# Patient Record
Sex: Male | Born: 1995 | Race: Black or African American | Hispanic: No | Marital: Single | Smoking: Current some day smoker
Health system: Southern US, Community
[De-identification: ages and names within clinical notes are randomized; demographics above are authoritative.]

---

## 2017-04-29 ENCOUNTER — Encounter (HOSPITAL_COMMUNITY): Payer: Self-pay | Admitting: Emergency Medicine

## 2017-04-29 DIAGNOSIS — K029 Dental caries, unspecified: Secondary | ICD-10-CM | POA: Insufficient documentation

## 2017-04-30 ENCOUNTER — Emergency Department (HOSPITAL_COMMUNITY)
Admission: EM | Admit: 2017-04-30 | Discharge: 2017-04-30 | Disposition: A | Discharge status: Home / Self Care | Payer: Self-pay | Attending: Emergency Medicine | Admitting: Emergency Medicine

## 2017-04-30 DIAGNOSIS — K029 Dental caries, unspecified: Secondary | ICD-10-CM

## 2017-04-30 MED ORDER — IBUPROFEN 600 MG PO TABS: 600.0000 mg | ORAL_TABLET | Freq: Four times a day (QID) | ORAL | 0 refills | Status: DC | PRN

## 2017-04-30 MED ORDER — PENICILLIN V POTASSIUM 500 MG PO TABS: 500.0000 mg | ORAL_TABLET | Freq: Three times a day (TID) | ORAL | 0 refills | Status: DC

## 2017-04-30 NOTE — ED Provider Notes (Signed)
[facility] EMERGENCY DEPARTMENT Provider Note   CSN: [id number] Arrival date & time: 04/29/17  2241     History   Chief Complaint Chief Complaint  Patient presents with  . Dental Pain    HPI Cameron Hamilton is a 21 y.o. male.  HPI   21 year old male presenting with c/o dental pain.  Patient report having recurrent left-sided dental pain ongoing for the past 2 months.  Pain is described as a aching throbbing sensation, with occasional numbness sensation radiates to the back of his neck and shoulder.  Pain usually improves with taking aspirin.  There is no associated fever, chills, ear pain, runny nose, sneezing or coughing or neck pain.  He was [place] seen [place] outside hospital [place] complaints and was prescribed antibiotic which he did not finish.  [place] and does not have a dentist.  Denies any increasing pain with temperature changes, denies any recent injury.  Patient report he used to have braces when he was a child but was unable to follow-up with a dentist to have it removed.  He removed most of the braces but still have some retained hardware still in place.     History reviewed. No pertinent past medical history.  There are no active problems to display for this patient.   History reviewed. No pertinent surgical history.     Home Medications    Prior to Admission medications   Not on File    Family History No family history on file.  Social History Social History   Tobacco Use  . Smoking status: Never Smoker  . Smokeless tobacco: Never Used  Substance Use Topics  . Alcohol use: Yes    Comment: occ  . Drug use: Yes    Types: Marijuana    Comment: occ     Allergies   Patient has no known allergies.   Review of Systems Review of Systems  Constitutional: Negative for fever.  HENT: Positive for dental problem.   Neurological: Negative for headaches.     Physical Exam Updated  Vital Signs BP 133/74 (BP Location: Right Arm)   Pulse (!) 124   Temp 98.1 F (36.7 C) (Oral)   Resp 12   Ht 5\' 8"  (1.727 m)   Wt 68 kg (150 lb)   SpO2 98%   BMI 22.81 kg/m   Physical Exam  Constitutional: He appears well-developed and well-nourished. No distress.  HENT:  Head: Atraumatic.  Mouth: mild tenderness to tooth #18, with braces hardware in place.  Small cavity noted.  No gingival erythema, no abscess, no facial involvement. No trismus.   Eyes: Conjunctivae are normal.  Neck: Neck supple.  Lymphadenopathy:    He has no cervical adenopathy.  Neurological: He is alert.  Skin: No rash noted.  Psychiatric: He has a normal mood and affect.  Nursing note and vitals reviewed.    ED Treatments / Results  Labs (all labs ordered are listed, but only abnormal results are displayed) Labs Reviewed - No data to display  EKG  EKG Interpretation None       Radiology No results found.  Procedures Procedures (including critical care time)  Medications Ordered in ED Medications - No data to display   Initial Impression / Assessment and Plan / ED Course  I have reviewed the triage vital signs and the nursing notes.  Pertinent labs & imaging results that were available during my care of  the patient were reviewed by me and considered in my medical decision making (see chart for details).     BP 133/74 (BP Location: Right Arm)   Pulse (!) 124   Temp 98.1 F (36.7 C) (Oral)   Resp 12   Ht 5\' 8"  (1.727 m)   Wt 68 kg (150 lb)   SpO2 98%   BMI 22.81 kg/m    Final Clinical Impressions(s) / ED Diagnoses   Final diagnoses:  Pain due to dental caries    ED Discharge Orders        Ordered    penicillin v potassium (VEETID) 500 MG tablet  3 times daily     04/30/17 0135    ibuprofen (ADVIL,MOTRIN) 600 MG tablet  Every 6 hours PRN     04/30/17 0135     Patient with dentalgia.  No abscess requiring immediate incision and drainage.  Exam not concerning for  Ludwig's angina or pharyngeal abscess.  Will treat with abx and nsaids. Pt instructed to follow-up with dentist.  Discussed return precautions. Pt safe for discharge.    Fayrene Helperran, Demichael Traum, PA-C 04/30/17 [id number]    Azalia Bilisampos, Kevin, MD 04/30/17 [phone]

## 2017-05-17 ENCOUNTER — Other Ambulatory Visit: Payer: Self-pay

## 2017-05-17 ENCOUNTER — Encounter: Payer: Self-pay | Admitting: Emergency Medicine

## 2017-05-17 ENCOUNTER — Emergency Department
Admission: EM | Admit: 2017-05-17 | Discharge: 2017-05-17 | Disposition: A | Discharge status: Home / Self Care | Payer: Self-pay | Attending: Emergency Medicine | Admitting: Emergency Medicine

## 2017-05-17 ENCOUNTER — Emergency Department: Payer: Self-pay

## 2017-05-17 DIAGNOSIS — R0789 Other chest pain: Secondary | ICD-10-CM | POA: Insufficient documentation

## 2017-05-17 DIAGNOSIS — K219 Gastro-esophageal reflux disease without esophagitis: Secondary | ICD-10-CM | POA: Insufficient documentation

## 2017-05-17 DIAGNOSIS — K5900 Constipation, unspecified: Secondary | ICD-10-CM | POA: Insufficient documentation

## 2017-05-17 LAB — BASIC METABOLIC PANEL
ANION GAP: 9 (ref 5–15)
BUN: 18 mg/dL (ref 6–20)
CO2: 27 mmol/L (ref 22–32)
Calcium: 9.4 mg/dL (ref 8.9–10.3)
Chloride: 103 mmol/L (ref 101–111)
Creatinine, Ser: 1.15 mg/dL (ref 0.61–1.24)
GFR calc Af Amer: >60 mL/min (ref >60)
GLUCOSE: 100 mg/dL — AB (ref 65–99)
POTASSIUM: 3.7 mmol/L (ref 3.5–5.1)
SODIUM: 139 mmol/L (ref 135–145)

## 2017-05-17 LAB — CBC
HEMATOCRIT: 46.8 % (ref 40.0–52.0)
HEMOGLOBIN: 15.9 g/dL (ref 13.0–18.0)
MCH: 29.5 pg (ref 26.0–34.0)
MCHC: 34 g/dL (ref 32.0–36.0)
MCV: 86.9 fL (ref 80.0–100.0)
Platelets: 257 10*3/uL (ref 150–440)
RBC: 5.39 MIL/uL (ref 4.40–5.90)
RDW: 13.4 % (ref 11.5–14.5)
WBC: 5.4 10*3/uL (ref 3.8–10.6)

## 2017-05-17 LAB — TROPONIN I: Troponin I: <0.03 ng/mL (ref <0.03)

## 2017-05-17 LAB — LIPASE, BLOOD: LIPASE: 20 U/L (ref 11–51)

## 2017-05-17 MED ORDER — GI COCKTAIL ~~LOC~~
30.0000 mL | Freq: Once | ORAL | Status: AC
  Administered 2017-05-17: 30 mL via ORAL
  Filled 2017-05-17: qty 30

## 2017-05-17 MED ORDER — RANITIDINE HCL 300 MG PO TABS: 300.0000 mg | ORAL_TABLET | Freq: Every day | ORAL | 1 refills | Status: DC

## 2017-05-17 NOTE — Discharge Instructions (Signed)
You may start Enid BaasMira Lax daily to help with constipation.   Take the Zantac daily either 30 minutes before breakfast or before bed.  Follow up with primary care when possible.  Return to the ER for symptoms of concern if unable to schedule an appointment.

## 2017-05-17 NOTE — ED Notes (Signed)
See triage note  Presents with generalized abd pain for the past 2 days  Describes pain as burning   Pain is from epigastric area into mid abd  Denies any n/v/  Fever or diarrhea

## 2017-05-17 NOTE — ED Provider Notes (Signed)
ED ECG REPORT I, Arelia Longestavid M Bailey Faiella, the attending physician, personally viewed and interpreted this ECG.   Date: 05/17/2017  EKG Time: 1049  Rate: 82  Rhythm: normal sinus rhythm  Axis: Normal  Intervals:none  ST&T Change: Concave ST elevations diffusely consistent with early repolarization.  No T wave inversions that are abnormal.    Myrna BlazerSchaevitz, Karely Hurtado Matthew, MD 05/17/17 [phone]

## 2017-05-17 NOTE — ED Provider Notes (Signed)
[facility] Emergency Department Provider Note  ___________________________________________   First MD Initiated Contact with Patient 05/17/17 1203     (approximate)  I have reviewed the triage vital signs and the nursing notes.   HISTORY  Chief Complaint Abdominal Pain and Chest Pain   HPI Cameron Hamilton is a 22 y.o. male who presents to the emergency department for evaluation and treatment of chest and abdomen "burning."  He denies any previous cardiac history.  He denies shortness of breath.  He denies illicit drug use.  Patient states that he has been drinking quite a bit of coffee recently, but other wise denies changes to his diet.  He has not taken any over-the-counter or other medications for this complaint.  History reviewed. No pertinent past medical history.  There are no active problems to display for this patient.   History reviewed. No pertinent surgical history.  Prior to Admission medications   Medication Sig Start Date End Date Taking? Authorizing Provider  ranitidine (ZANTAC) 300 MG tablet Take 1 tablet (300 mg total) by mouth at bedtime. 05/17/17 05/17/18  Chinita Pester, FNP    Allergies Patient has no known allergies.  History reviewed. No pertinent family history.  Social History Social History   Tobacco Use  . Smoking status: Never Smoker  . Smokeless tobacco: Never Used  Substance Use Topics  . Alcohol use: Yes    Comment: occ  . Drug use: Yes    Types: Marijuana    Comment: occ    Review of Systems  Constitutional: No fever/chills Eyes: No visual changes. ENT: No sore throat. Cardiovascular: Positive for burning sensation in his chest.   Respiratory: Denies shortness of breath. Gastrointestinal: Abdominal burning.  No nausea, no vomiting.  No diarrhea.  No constipation. Genitourinary: Negative for dysuria. Musculoskeletal: Negative for back pain. Skin: Negative for rash. Neurological: Negative for headaches,  focal weakness or numbness. ____________________________________________   PHYSICAL EXAM:  VITAL SIGNS: ED Triage Vitals  Enc Vitals Group     BP 05/17/17 1057 126/71     Pulse Rate 05/17/17 1057 88     Resp 05/17/17 1057 16     Temp 05/17/17 1057 98.2 F (36.8 C)     Temp Source 05/17/17 1057 Oral     SpO2 05/17/17 1057 100 %     Weight 05/17/17 1046 150 lb (68 kg)     Height 05/17/17 1046 5\' 8"  (1.727 m)     Head Circumference --      Peak Flow --      Pain Score 05/17/17 1045 10     Pain Loc --      Pain Edu? --      Excl. in [place]? --     Constitutional: Alert and oriented. Well appearing and in no acute distress. Eyes: Conjunctivae are normal. PERRL. Head: Atraumatic. Nose: No congestion/rhinnorhea. Mouth/Throat: Mucous membranes are moist.  Oropharynx non-erythematous. Neck: No stridor.   Cardiovascular: Normal rate, regular rhythm. Grossly normal heart sounds.  Good peripheral circulation. Respiratory: Normal respiratory effort.  No retractions. Lungs CTAB. Gastrointestinal: Soft and nontender. No distention. No abdominal bruits. No CVA tenderness. Musculoskeletal: No lower extremity tenderness nor edema.  No joint effusions. Neurologic:  Normal speech and language. No gross focal neurologic deficits are appreciated. No gait instability. Skin:  Skin is warm, dry and intact. No rash noted. Psychiatric: Mood and affect are normal. Speech and behavior are normal.  ____________________________________________   LABS (all labs ordered are listed,  but only abnormal results are displayed)  Labs Reviewed  BASIC METABOLIC PANEL - Abnormal; Notable for the following components:      Result Value   Glucose, Bld 100 (*)    All other components within normal limits  CBC  TROPONIN I  LIPASE, BLOOD   ____________________________________________  EKG  ED ECG REPORT I, Kem Boroughsari Graylee Arutyunyan, the FNP, personally viewed and interpreted this ECG.   Date: 05/17/2017  EKG Time:  10:49 AM  Rate: 82  Rhythm: Normal sinus rhythm with PR interval consistent with early repolarization.  Axis: No axis deviation  Intervals: No conduction defects.  ST&T Change: No ST depression or elevation  ____________________________________________  RADIOLOGY  Dg Chest 2 View  Result Date: 05/17/2017 CLINICAL DATA:  Central chest pain and burning. Abdominal discomfort. EXAM: CHEST  2 VIEW COMPARISON:  None. FINDINGS: Heart size is normal. Mediastinal shadows are normal. The lungs are clear. No bronchial thickening. No infiltrate, mass, effusion or collapse. Pulmonary vascularity is normal. No bony abnormality. IMPRESSION: Normal chest Electronically Signed   By: Paulina FusiMark  Shogry M.D.   On: 05/17/2017 11:17    ____________________________________________   PROCEDURES  Procedure(s) performed: None  Procedures  Critical Care performed: No  ____________________________________________   INITIAL IMPRESSION / ASSESSMENT AND PLAN / ED COURSE   22 year old male presenting to the emergency department for evaluation treatment of chest wall burning and abdominal burning which was relieved after GI cocktail.  Patient also states that he has issues with constipation intermittently.  He was given prescriptions for Zantac and advised to take MiraLAX daily.  He was also instructed to follow-up with primary care provider for symptoms that are not improving with these medications.  He was instructed to return to the emergency department for symptoms of change or worsen if he is unable to schedule appointment.      ____________________________________________   FINAL CLINICAL IMPRESSION(S) / ED DIAGNOSES  Final diagnoses:  Gastroesophageal reflux disease without esophagitis     ED Discharge Orders        Ordered    ranitidine (ZANTAC) 300 MG tablet  Daily at bedtime     05/17/17 1257       Note:  This document was prepared using Dragon voice recognition software and may include  unintentional dictation errors.    Chinita Pesterriplett, Rashell Shambaugh B, FNP 05/17/17 1315    Myrna BlazerSchaevitz, David Matthew, MD 05/17/17 [phone]

## 2017-05-17 NOTE — ED Triage Notes (Addendum)
C/o burning feeling to chest and abdomen. NAD. No other symptoms per pt.  Ambulatory. Unlabored.  Not worse or better with anything including food or no food.

## 2017-07-30 ENCOUNTER — Emergency Department (HOSPITAL_COMMUNITY)
Admission: EM | Admit: 2017-07-30 | Discharge: 2017-07-30 | Disposition: A | Discharge status: Home / Self Care | Payer: Self-pay | Attending: Emergency Medicine | Admitting: Emergency Medicine

## 2017-07-30 ENCOUNTER — Emergency Department (HOSPITAL_COMMUNITY): Payer: Self-pay

## 2017-07-30 ENCOUNTER — Encounter (HOSPITAL_COMMUNITY): Payer: Self-pay | Admitting: *Deleted

## 2017-07-30 ENCOUNTER — Other Ambulatory Visit: Payer: Self-pay

## 2017-07-30 DIAGNOSIS — M25552 Pain in left hip: Secondary | ICD-10-CM | POA: Insufficient documentation

## 2017-07-30 DIAGNOSIS — R1032 Left lower quadrant pain: Secondary | ICD-10-CM | POA: Insufficient documentation

## 2017-07-30 DIAGNOSIS — N50819 Testicular pain, unspecified: Secondary | ICD-10-CM

## 2017-07-30 DIAGNOSIS — N50812 Left testicular pain: Secondary | ICD-10-CM | POA: Insufficient documentation

## 2017-07-30 DIAGNOSIS — Z79899 Other long term (current) drug therapy: Secondary | ICD-10-CM | POA: Insufficient documentation

## 2017-07-30 LAB — URINALYSIS, ROUTINE W REFLEX MICROSCOPIC
BILIRUBIN URINE: NEGATIVE
Glucose, UA: NEGATIVE mg/dL
HGB URINE DIPSTICK: NEGATIVE
Ketones, ur: NEGATIVE mg/dL
Leukocytes, UA: NEGATIVE
Nitrite: NEGATIVE
PH: 7 (ref 5.0–8.0)
Protein, ur: NEGATIVE mg/dL
SPECIFIC GRAVITY, URINE: 1.017 (ref 1.005–1.030)

## 2017-07-30 MED ORDER — NAPROXEN 375 MG PO TABS: 375.0000 mg | ORAL_TABLET | Freq: Two times a day (BID) | ORAL | 0 refills | Status: AC

## 2017-07-30 NOTE — Discharge Instructions (Signed)
You will be contacted if your labs indicate additional treatment is needed. There is no evidence of epididymitis, testicular torsion, hernia, or other scrotal abnormalities on today's exam.

## 2017-07-30 NOTE — ED Triage Notes (Signed)
Pt states that he possibly has a hernia. Pain is in left hip and radiates down his thigh and into his left testicle. No acute distress is noted at triage. Denies urinary symptoms.

## 2017-07-30 NOTE — ED Provider Notes (Signed)
[facility] EMERGENCY DEPARTMENT Provider Note   CSN: [id number] Arrival date & time: 07/30/17  1302     History   Chief Complaint Chief Complaint  Patient presents with  . Hip Pain  . Groin Pain    HPI Cameron Hamilton is a 22 y.o. male.  Patient with left hip and left groin pain. Groin pain radiates into left testicle. Sexually active. Denies dysuria, discharge.   The history is provided by the patient. No language interpreter was used.  Hip Pain  This is a new problem. The current episode started more than 2 days ago. The problem has been gradually worsening.  Groin Pain  This is a new problem. The current episode started more than 2 days ago. The problem occurs every several days. The problem has not changed since onset.   History reviewed. No pertinent past medical history.  There are no active problems to display for this patient.   History reviewed. No pertinent surgical history.     Home Medications    Prior to Admission medications   Medication Sig Start Date End Date Taking? Authorizing Provider  ranitidine (ZANTAC) 300 MG tablet Take 1 tablet (300 mg total) by mouth at bedtime. 05/17/17 05/17/18  Chinita Pester, FNP    Family History History reviewed. No pertinent family history.  Social History Social History   Tobacco Use  . Smoking status: Never Smoker  . Smokeless tobacco: Never Used  Substance Use Topics  . Alcohol use: Yes    Comment: occ  . Drug use: Yes    Types: Marijuana    Comment: occ     Allergies   Patient has no known allergies.   Review of Systems Review of Systems  All other systems reviewed and are negative.    Physical Exam Updated Vital Signs BP 131/82 (BP Location: Right Arm)   Pulse 72   Temp 98.1 F (36.7 C) (Oral)   Resp 18   SpO2 100%   Physical Exam  Constitutional: He is oriented to person, place, and time. He appears well-developed and well-nourished.  HENT:  Head: Atraumatic.    Eyes: Conjunctivae are normal.  Neck: Neck supple.  Cardiovascular: Normal rate and regular rhythm.  Pulmonary/Chest: Effort normal and breath sounds normal.  Abdominal: Soft. Bowel sounds are normal. Hernia confirmed negative in the right inguinal area and confirmed negative in the left inguinal area.  Genitourinary: Penis normal. Cremasteric reflex is present. Left testis shows tenderness. Left testis shows no mass and no swelling. No penile tenderness.  Genitourinary Comments: Epididymal tenderness.   Musculoskeletal: Normal range of motion. He exhibits no tenderness or deformity.  Neurological: He is alert and oriented to person, place, and time.  Skin: Skin is warm and dry.  Psychiatric: He has a normal mood and affect.  Nursing note and vitals reviewed.    ED Treatments / Results  Labs (all labs ordered are listed, but only abnormal results are displayed) Labs Reviewed  RPR  HIV ANTIBODY (ROUTINE TESTING)  URINALYSIS, ROUTINE W REFLEX MICROSCOPIC  GC/CHLAMYDIA PROBE AMP ([place]) NOT AT [facility]    EKG  EKG Interpretation None       Radiology No results found.  Procedures Procedures (including critical care time)  Medications Ordered in ED Medications - No data to display   Initial Impression / Assessment and Plan / ED Course  I have reviewed the triage vital signs and the nursing notes.  Pertinent labs & imaging results that were available  during my care of the patient were reviewed by me and considered in my medical decision making (see chart for details).     Patient with sciatica like presentation of pain in left leg. No neurological deficits and normal neuro exam.  Patient is ambulatory.  No loss of bowel or bladder control.  No concern for cauda equina.  No fever, night sweats, weight loss, h/o cancer, IVDA, no recent procedure to back. No urinary symptoms suggestive of UTI.  Supportive care and return precaution discussed. Appears safe for discharge at  this time. Follow up as indicated in discharge paperwork.   Patient also reporting left sided scrotal/testicle pain. No indication of torsion, epididymitis, hydrocele, or varicocele. No hernia on exam. Patient is sexually active, STD screening performed. Discussed safe sexual practices. Pt appears safe for discharge.   Final Clinical Impressions(s) / ED Diagnoses   Final diagnoses:  Left hip pain  Left inguinal pain    ED Discharge Orders        Ordered    naproxen (NAPROSYN) 375 MG tablet  2 times daily     07/30/17 2329       Felicie MornSmith, Kyle Stansell, NP 07/31/17 0133    Maia PlanLong, Joshua G, MD 07/31/17 [phone]

## 2017-07-30 NOTE — ED Notes (Signed)
ED Provider at bedside. 

## 2017-07-30 NOTE — ED Notes (Signed)
Patient transported to Ultrasound 

## 2017-07-31 LAB — HIV ANTIBODY (ROUTINE TESTING W REFLEX): HIV Screen 4th Generation wRfx: NONREACTIVE

## 2017-07-31 LAB — RPR: RPR Ser Ql: NONREACTIVE

## 2017-07-31 LAB — GC/CHLAMYDIA PROBE AMP (~~LOC~~) NOT AT ARMC
Chlamydia: NEGATIVE
NEISSERIA GONORRHEA: NEGATIVE

## 2018-04-14 IMAGING — CR DG CHEST 2V
1 series · 2 of 2 positions shown · non-contrast
Comparison: None.

CLINICAL DATA: Central chest pain and burning. Abdominal
discomfort.

EXAM:
CHEST  2 VIEW

[Series 1: w chest pa · 0.14mm/px · 2 of 2 slices shown]
[im 1/2]
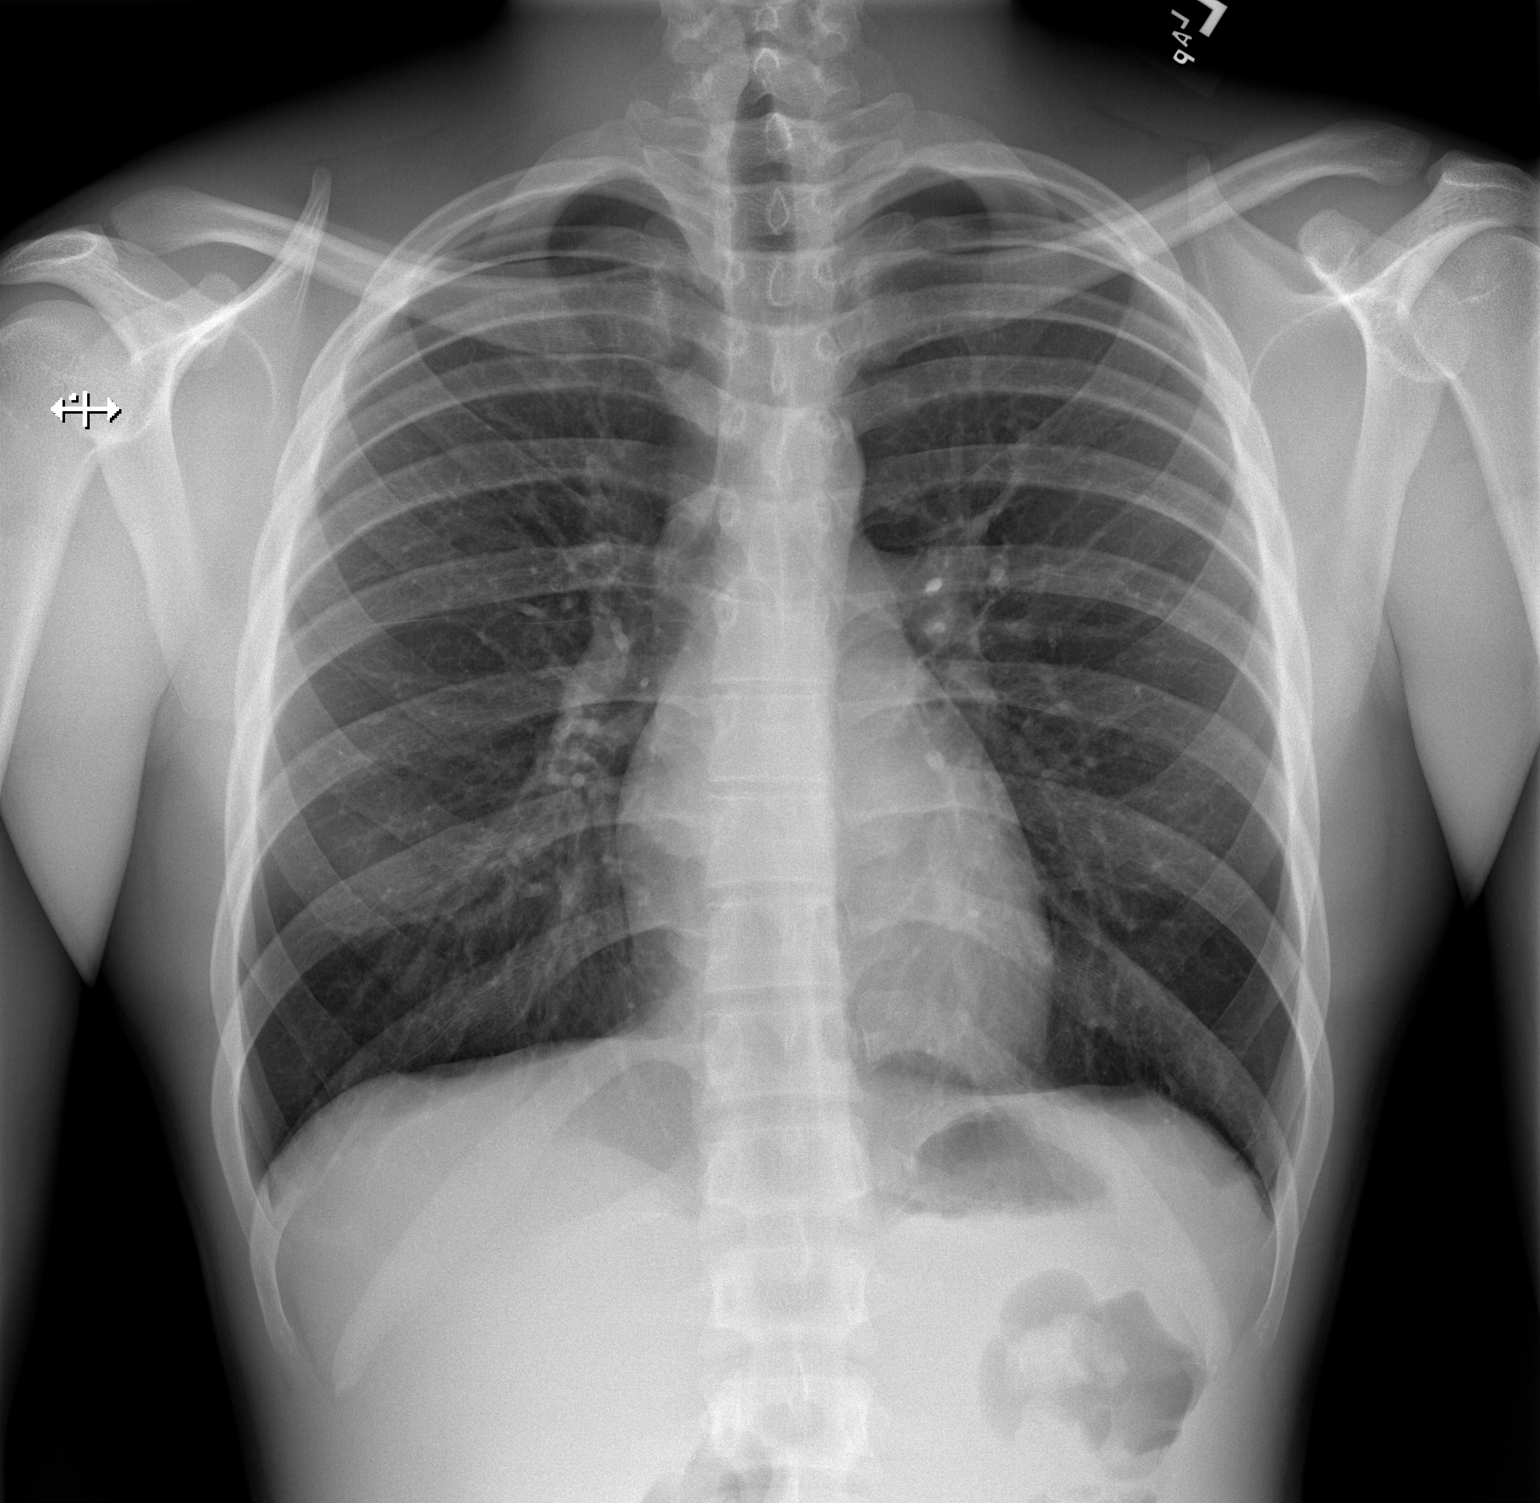
[im 2/2]
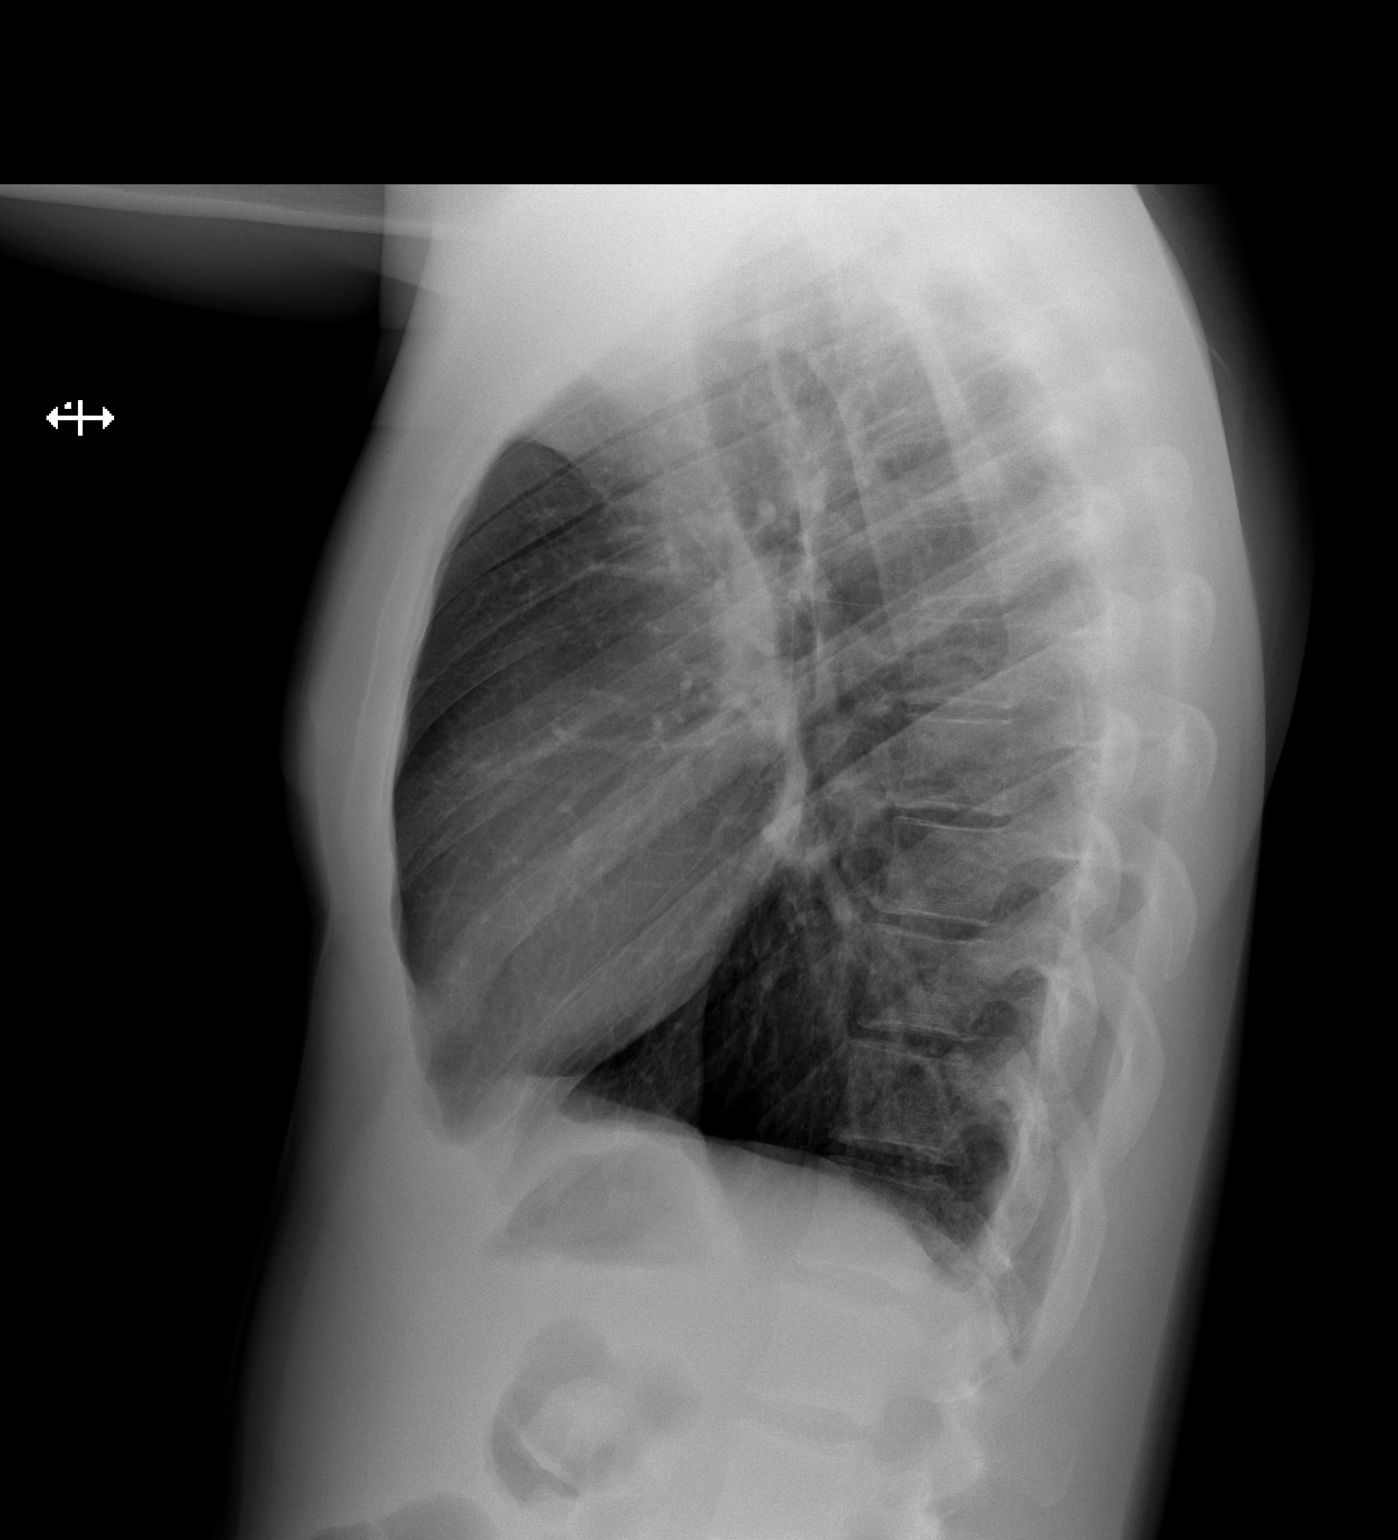

[2 of 2 positions shown; findings below may reference images not displayed]

FINDINGS: Heart size is normal. Mediastinal shadows are normal. The lungs are
clear. No bronchial thickening. No infiltrate, mass, effusion or
collapse. Pulmonary vascularity is normal. No bony abnormality.
IMPRESSION: Normal chest

## 2018-08-22 IMAGING — US US SCROTUM W/ DOPPLER COMPLETE
1 series · 14 of 25 positions shown · non-contrast
Comparison: None.

CLINICAL DATA: Testicle pain for 1 month

EXAM:
SCROTAL ULTRASOUND
DOPPLER ULTRASOUND OF THE TESTICLES
TECHNIQUE: Complete ultrasound examination of the testicles, epididymis, and
other scrotal structures was performed. Color and spectral Doppler
ultrasound were also utilized to evaluate blood flow to the
testicles.

[Series 1: us scrotum w/ doppler complete · 0.06mm/px · 14 of 45 slices shown]
[im 1/45]
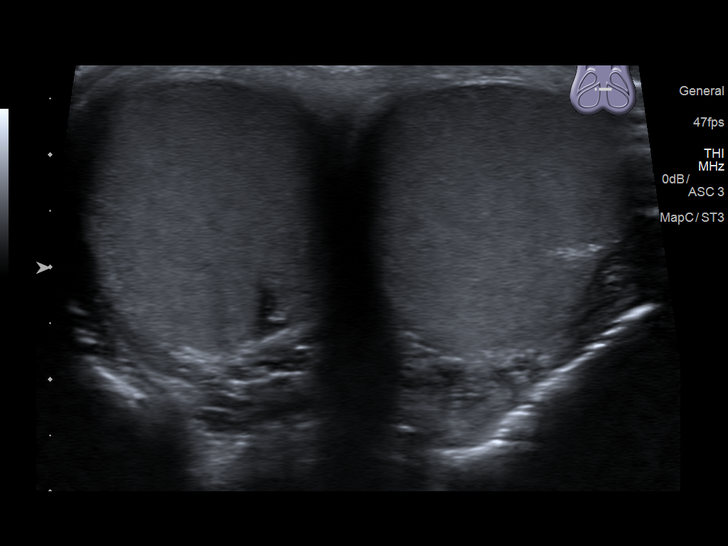
[im 4/45]
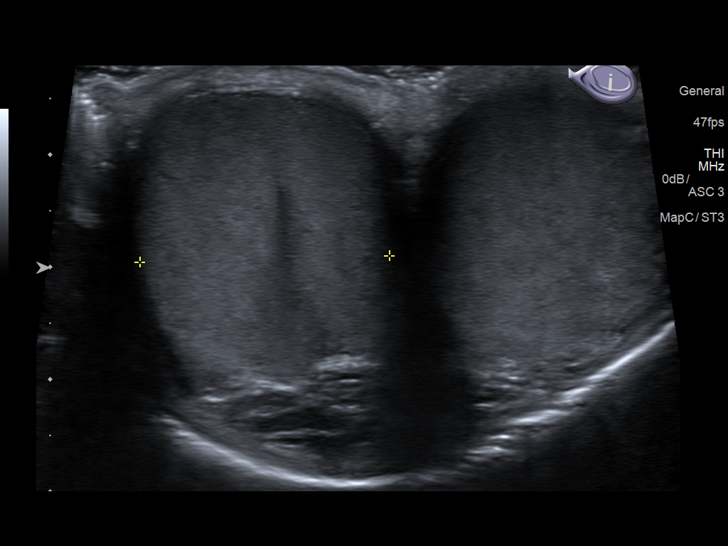
[im 8/45]
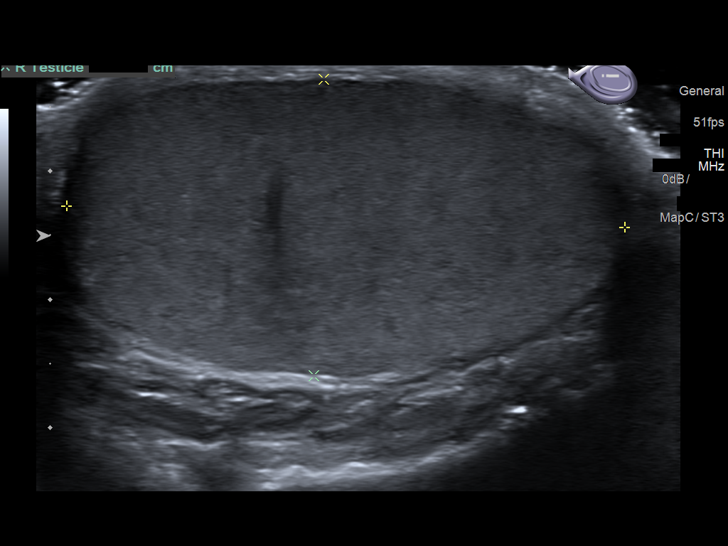
[im 12/45]
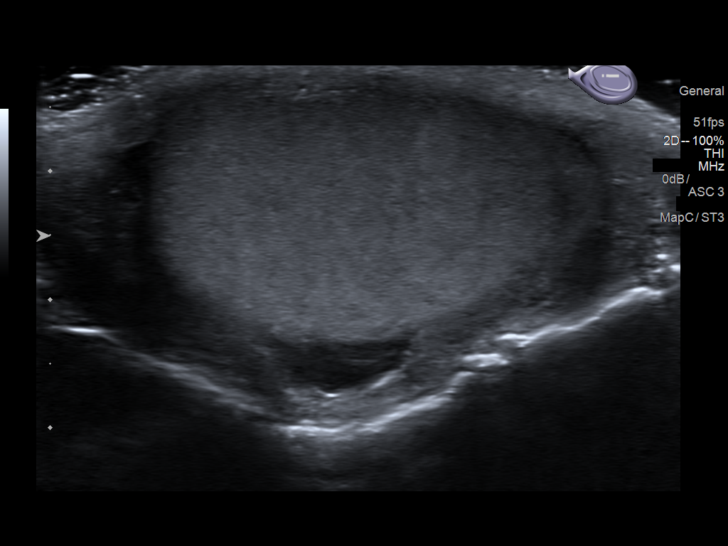
[im 15/45]
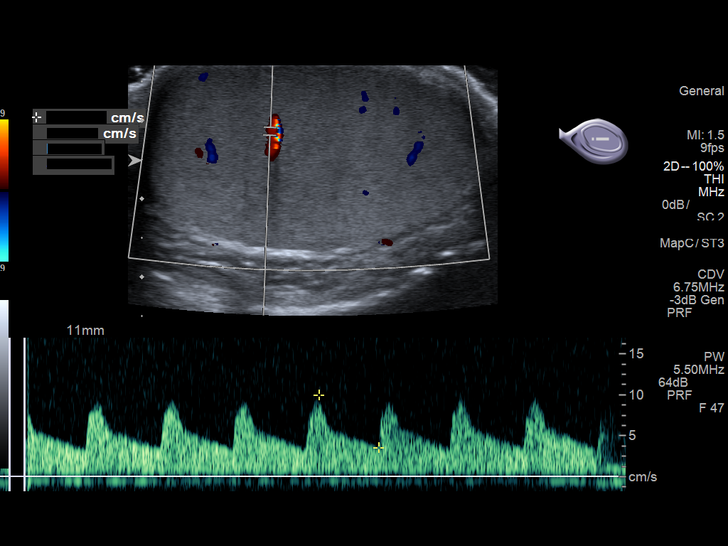
[im 17/45]
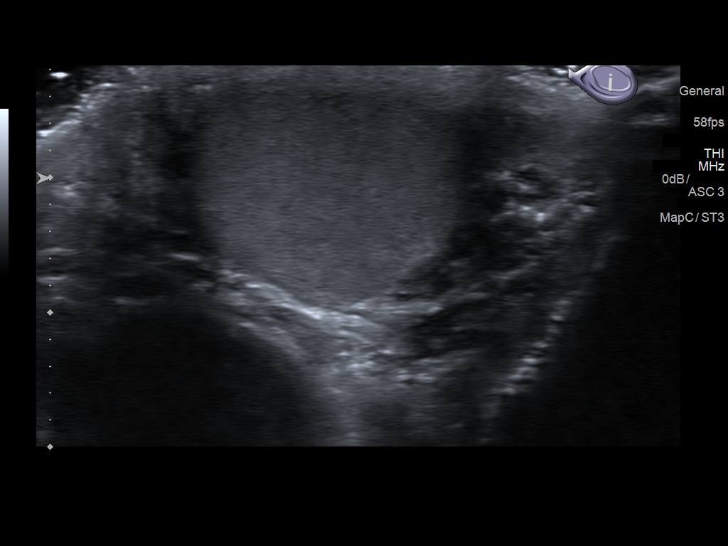
[im 21/45]
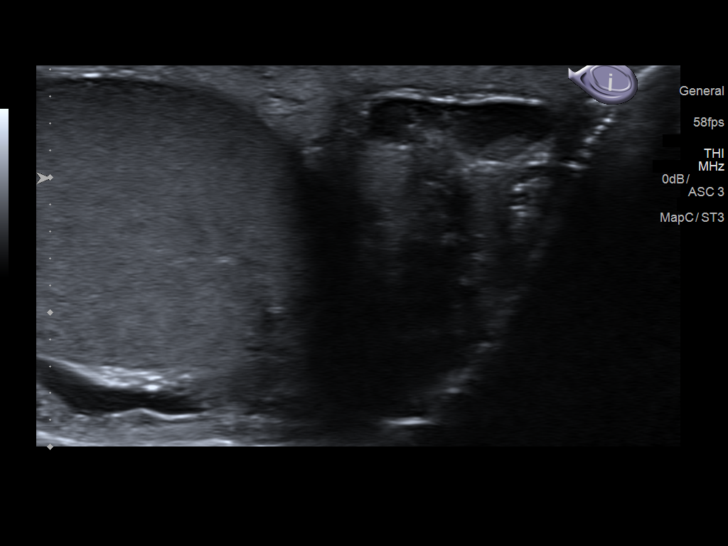
[im 24/45]
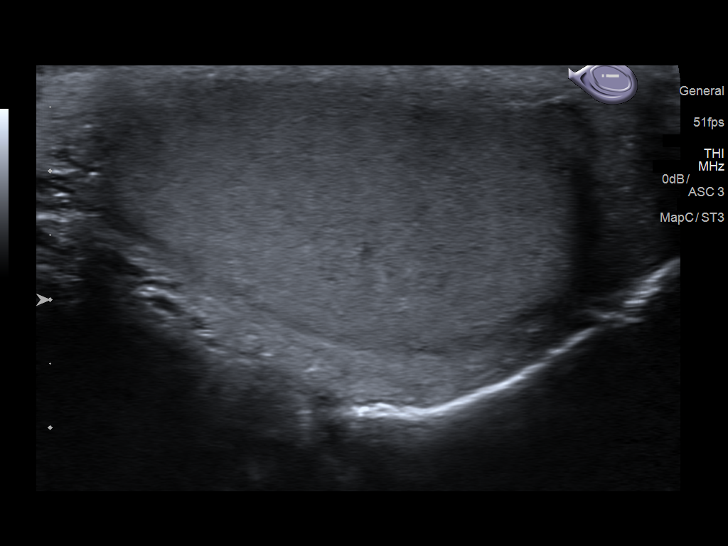
[im 28/45]
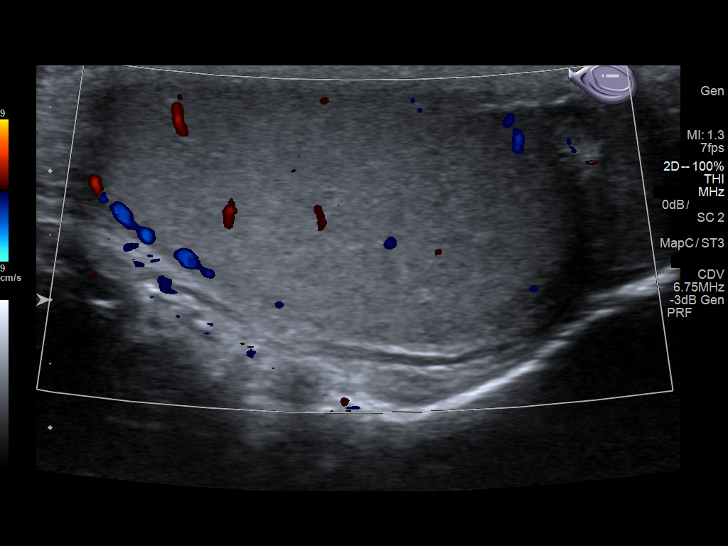
[im 30/45]
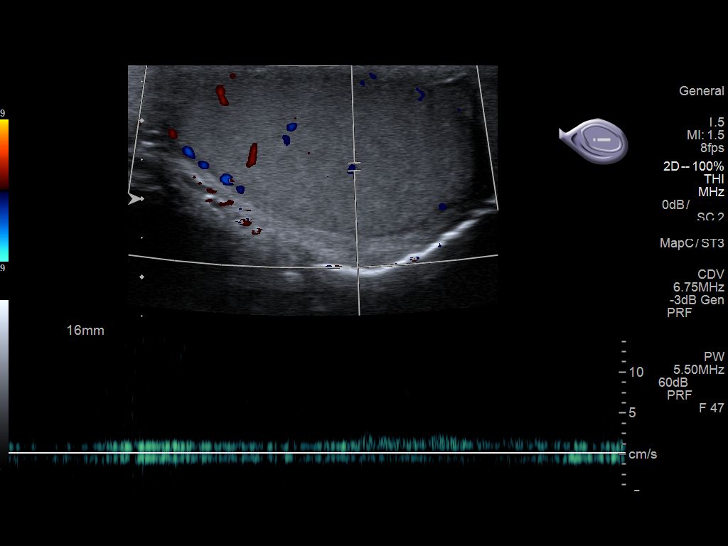
[im 34/45]
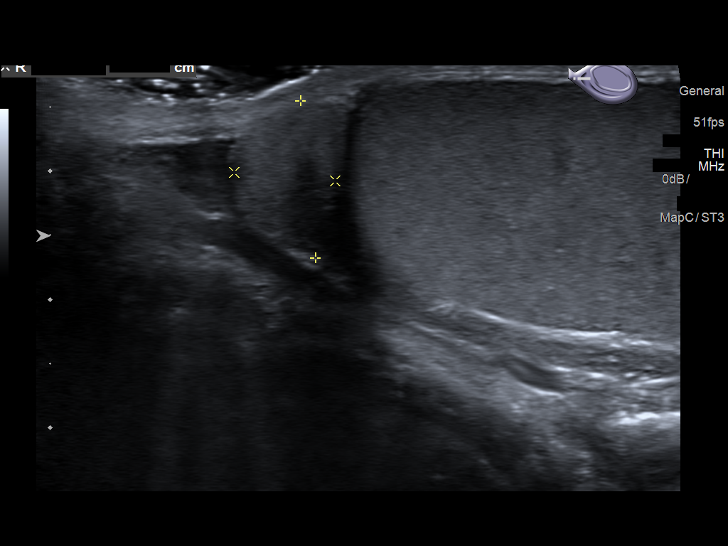
[im 37/45]
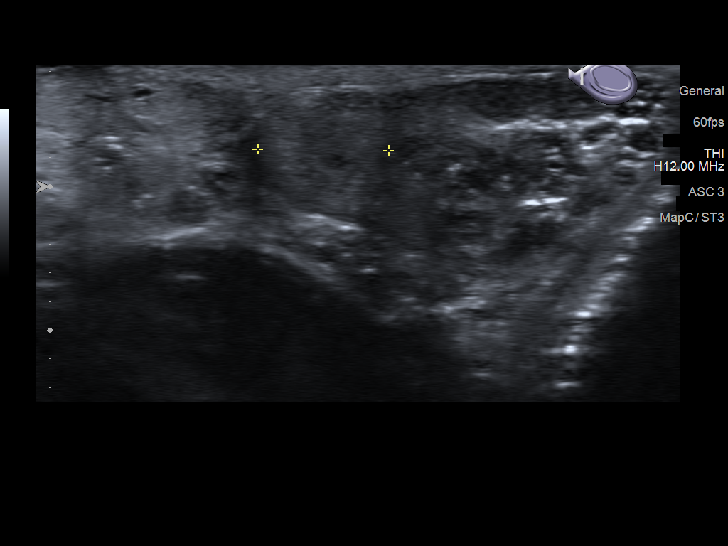
[im 41/45]
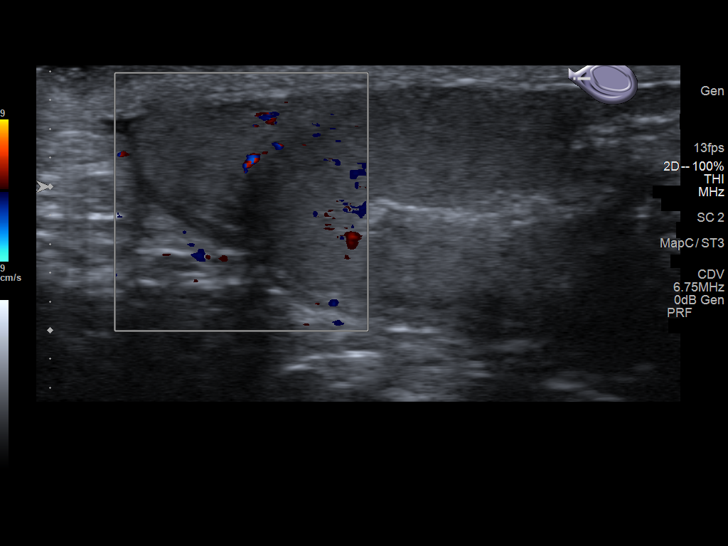
[im 45/45]
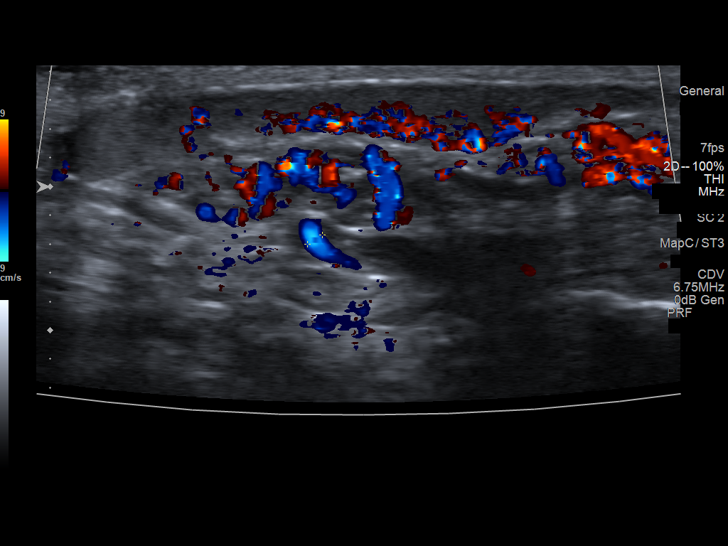

[14 of 25 positions shown; findings below may reference images not displayed]

FINDINGS: Right testicle

Measurements: 4.4 x 2.3 x 2.2 cm. No mass or microlithiasis
visualized.

Left testicle

Measurements: 4.5 x 2.7 x 2.7 cm. No mass or microlithiasis
visualized.

Right epididymis:  Normal in size and appearance.

Left epididymis:  Normal in size and appearance.

Hydrocele:  None visualized.

Varicocele:  None visualized.

Pulsed Doppler interrogation of both testes demonstrates normal low
resistance arterial and venous waveforms bilaterally.
IMPRESSION: Negative scrotal ultrasound

## 2022-06-17 ENCOUNTER — Other Ambulatory Visit: Payer: Self-pay

## 2022-06-17 ENCOUNTER — Emergency Department
Admission: EM | Admit: 2022-06-17 | Discharge: 2022-06-17 | Disposition: A | Discharge status: Home / Self Care | Payer: Self-pay | Attending: Emergency Medicine | Admitting: Emergency Medicine

## 2022-06-17 DIAGNOSIS — G4700 Insomnia, unspecified: Secondary | ICD-10-CM | POA: Insufficient documentation

## 2022-06-17 MED ORDER — TRAZODONE HCL 50 MG PO TABS: 50.0000 mg | ORAL_TABLET | Freq: Every day | ORAL | Status: DC

## 2022-06-17 MED ORDER — TRAZODONE HCL 50 MG PO TABS
50.0000 mg | ORAL_TABLET | ORAL | Status: AC
  Administered 2022-06-17: 50 mg via ORAL
  Filled 2022-06-17: qty 1

## 2022-06-17 MED ORDER — TRAZODONE HCL 50 MG PO TABS: 50.0000 mg | ORAL_TABLET | Freq: Every day | ORAL | 0 refills | Status: AC

## 2022-06-17 NOTE — ED Notes (Signed)
Pts family member, who dropped him off would like to be called re: pts sx.  He could not stay due to young children in the car.  Lorenzo - [phone]

## 2022-06-17 NOTE — Discharge Instructions (Signed)
Please read through the included information about insomnia as well as the list of local primary care providers.  The best option is to avoid drinking alcohol or using any other substances prior to going to bed, but we also wrote your prescription for medication that may help.  Please only use it as needed and take it according to label instructions.  In the meantime, you should call one of the primary care doctors listed and establish care in our clinic so that you have a doctor with whom you can follow-up and help with your sleep hygiene and other ongoing medical issues.    Return to the emergency department if you develop new or worsening symptoms that concern you.

## 2022-06-17 NOTE — ED Provider Notes (Signed)
[facility] Provider Note    Event Date/Time   First MD Initiated Contact with Patient 06/17/22 [phone]     (approximate)   History   Insomnia   HPI  Cameron Hamilton is a 27 y.o. male who presents for evaluation of insomnia.  He said that he has not been able to sleep for couple of days.  He said that he feels sleepy but as soon as he falls asleep he wakes back up.  This happened once in the past and he fell he was awake for about 5 days.  At that point he started having migraines or headaches and he does not want that to happen again so he thought he would come to the emergency department to see if anything can help.  He denies any excessive caffeine use and he denies drug and alcohol use.  He said he takes no medications.  He denies having any specific stressors or anxiety.  He denies auditory and visual hallucinations.  He has no medical conditions.  He denies headache, chest pain, shortness of breath, nausea, vomiting, and abdominal pain.  No recent fever.     Physical Exam   Triage Vital Signs: ED Triage Vitals  Enc Vitals Group     BP 06/17/22 0318 (!) 146/78     Pulse Rate 06/17/22 0318 82     Resp 06/17/22 0318 16     Temp 06/17/22 0318 99.1 F (37.3 C)     Temp Source 06/17/22 0318 Oral     SpO2 06/17/22 0318 98 %     Weight 06/17/22 0317 65.8 kg (145 lb)     Height 06/17/22 0317 1.727 m (5\' 8" )     Head Circumference --      Peak Flow --      Pain Score 06/17/22 0324 6     Pain Loc --      Pain Edu? --      Excl. in [place]? --     Most recent vital signs: Vitals:   06/17/22 0318  BP: (!) 146/78  Pulse: 82  Resp: 16  Temp: 99.1 F (37.3 C)  SpO2: 98%     General: Awake, no distress.  CV:  Good peripheral perfusion.  Resp:  Normal effort. Speaking easily and comfortably, no accessory muscle usage nor intercostal retractions.   Abd:  No distention.  Other:  Denies SI and HI, denies hallucinations, denies substance use.   ED Results  / Procedures / Treatments   Labs (all labs ordered are listed, but only abnormal results are displayed) Labs Reviewed - No data to display   PROCEDURES:  Critical Care performed: No  Procedures   MEDICATIONS ORDERED IN ED: Medications  traZODone (DESYREL) tablet 50 mg (has no administration in time range)     IMPRESSION / MDM / ASSESSMENT AND PLAN / ED COURSE  I reviewed the triage vital signs and the nursing notes.                              Differential diagnosis includes, but is not limited to, primary insomnia, medication or substance induced insomnia, psychiatric illness such as mania or bipolar disorder, less likely schizophrenia.  Patient's presentation is most consistent with acute, uncomplicated illness.  Patient is well-appearing and in no distress.  Normal vital signs other than mild hypertension.  He has no concerning complaints or warning signs of acute psychiatric illness.  He  denies any use of medications or drugs.  He is well-appearing, not exhibiting any bizarre behavior, not reporting any hallucinations, not responding to internal stimuli.  He is ambulatory without difficulty and has no neurological deficits.  [name] with psychiatry was present in the emergency department when the patient arrived and I consulted her for evaluation.  She agrees with me that there is no indication for inpatient treatment or additional evaluation at this time.  She recommended trazodone 50 mg p.o. which I ordered stat and then I wrote a prescription for some trazodone as an outpatient.  I strongly encouraged him to follow-up with a primary care doctor.  No indication for hospitalization or additional evaluation.  I gave my usual and customary follow-up recommendations and return precautions.  Interventions/Medications given:  trazodone 50 mg PO [facility] Course my include additional interventions not listed in this section:)       FINAL CLINICAL IMPRESSION(S) / ED DIAGNOSES    Final diagnoses:  Insomnia, unspecified type     Rx / DC Orders   ED Discharge Orders          Ordered    traZODone (DESYREL) 50 MG tablet  Daily at bedtime        06/17/22 0412             Note:  This document was prepared using Dragon voice recognition software and may include unintentional dictation errors.   Hinda Kehr, MD 06/17/22 [phone]

## 2022-06-17 NOTE — ED Triage Notes (Signed)
Pt arrives via POV with CC of not sleeping since Friday. Pt reports hx of same x1 - seen but pt states not told why he was unable to sleep. As best this RN can identify, pt describes feeling somewhat disassociated (awake but not awake).

## 2022-06-17 NOTE — ED Notes (Signed)
Patient family member reports to staff that there has been a lot of deaths in the family and he is concerned for the pt mental health. Family member, who patient lives with, can [email or web address] reached at [phone]
